# Patient Record
Sex: Female | Born: 1950 | Hispanic: Yes | Marital: Married | Smoking: Never smoker
Health system: Southern US, Community
[De-identification: ages and names within clinical notes are randomized; demographics above are authoritative.]

## PROBLEM LIST (undated history)

## (undated) DIAGNOSIS — I4891 Unspecified atrial fibrillation: Secondary | ICD-10-CM

## (undated) HISTORY — PX: REPLACEMENT TOTAL KNEE BILATERAL: SUR1225

## (undated) HISTORY — PX: HERNIA REPAIR: SHX51

---

## 2020-06-17 ENCOUNTER — Emergency Department
Admission: EM | Admit: 2020-06-17 | Discharge: 2020-06-17 | Disposition: A | Discharge status: Home / Self Care | Payer: No Typology Code available for payment source | Attending: Emergency Medicine | Admitting: Emergency Medicine

## 2020-06-17 ENCOUNTER — Emergency Department: Payer: No Typology Code available for payment source

## 2020-06-17 ENCOUNTER — Other Ambulatory Visit: Payer: Self-pay

## 2020-06-17 ENCOUNTER — Encounter: Payer: Self-pay | Admitting: Emergency Medicine

## 2020-06-17 DIAGNOSIS — S161XXA Strain of muscle, fascia and tendon at neck level, initial encounter: Secondary | ICD-10-CM | POA: Insufficient documentation

## 2020-06-17 DIAGNOSIS — S199XXA Unspecified injury of neck, initial encounter: Secondary | ICD-10-CM | POA: Diagnosis present

## 2020-06-17 DIAGNOSIS — S0003XA Contusion of scalp, initial encounter: Secondary | ICD-10-CM | POA: Diagnosis not present

## 2020-06-17 DIAGNOSIS — Z7901 Long term (current) use of anticoagulants: Secondary | ICD-10-CM | POA: Insufficient documentation

## 2020-06-17 DIAGNOSIS — Y92410 Unspecified street and highway as the place of occurrence of the external cause: Secondary | ICD-10-CM | POA: Insufficient documentation

## 2020-06-17 HISTORY — DX: Unspecified atrial fibrillation: I48.91

## 2020-06-17 MED ORDER — METHOCARBAMOL 500 MG PO TABS: 500.0000 mg | ORAL_TABLET | Freq: Four times a day (QID) | ORAL | 0 refills | Status: AC

## 2020-06-17 MED ORDER — MELOXICAM 15 MG PO TABS: 15.0000 mg | ORAL_TABLET | Freq: Every day | ORAL | 0 refills | Status: AC
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
## 2020-06-17 NOTE — ED Notes (Signed)
Patient transported to CT 

## 2020-06-17 NOTE — ED Notes (Signed)
Discharge instructions reviewed , pt calm , collective .
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
